# Patient Record
Sex: Male | Born: 2000 | Race: White | Hispanic: No | Marital: Single | Smoking: Never smoker
Health system: Southern US, Community
[De-identification: ages and names within clinical notes are randomized; demographics above are authoritative.]

## PROBLEM LIST (undated history)

## (undated) DIAGNOSIS — F909 Attention-deficit hyperactivity disorder, unspecified type: Secondary | ICD-10-CM

---

## 2021-03-06 ENCOUNTER — Emergency Department
Admission: EM | Admit: 2021-03-06 | Discharge: 2021-03-06 | Disposition: A | Discharge status: Home / Self Care | Payer: Managed Care, Other (non HMO) | Attending: Emergency Medicine | Admitting: Emergency Medicine

## 2021-03-06 ENCOUNTER — Other Ambulatory Visit: Payer: Self-pay

## 2021-03-06 DIAGNOSIS — R509 Fever, unspecified: Secondary | ICD-10-CM | POA: Diagnosis present

## 2021-03-06 DIAGNOSIS — B349 Viral infection, unspecified: Secondary | ICD-10-CM | POA: Diagnosis not present

## 2021-03-06 DIAGNOSIS — R000 Tachycardia, unspecified: Secondary | ICD-10-CM | POA: Insufficient documentation

## 2021-03-06 DIAGNOSIS — Z20822 Contact with and (suspected) exposure to covid-19: Secondary | ICD-10-CM | POA: Insufficient documentation

## 2021-03-06 LAB — RESP PANEL BY RT-PCR (FLU A&B, COVID) ARPGX2
Influenza A by PCR: NEGATIVE
Influenza B by PCR: NEGATIVE
SARS Coronavirus 2 by RT PCR: NEGATIVE

## 2021-03-06 NOTE — ED Triage Notes (Signed)
Pt arrives via pov from home. Pt reports body aches 2 days ago and fever starting yesterday. NAD noted at this time. Pt denies any N/V/D.

## 2021-03-06 NOTE — ED Provider Notes (Signed)
[facility]  ____________________________________________   Event Date/Time   First MD Initiated Contact with Patient 03/06/21 1214     (approximate)  I have reviewed the triage vital signs and the nursing notes.   HISTORY  Chief Complaint Fever and Generalized Body Aches    HPI Noah Luna is a 20 y.o. male with no significant past medical history who presents with fever and body aches.  Symptoms started yesterday.  Has had a mild headache without associated nausea vomiting visual change numbness or weakness.  No neck pain.  Has also had diffuse myalgias and back pain that is bilateral.  No cough congestion shortness of breath or chest pain.  Has had decreased appetite but is still tolerating p.o.  No sick contacts.         History reviewed. No pertinent past medical history.  There are no problems to display for this patient.   History reviewed. No pertinent surgical history.  Prior to Admission medications   Not on File    Allergies Patient has no allergy information on record.  History reviewed. No pertinent family history.  Social History Social History   Tobacco Use   Smoking status: Never   Smokeless tobacco: Never  Substance Use Topics   Alcohol use: Never   Drug use: Never    Review of Systems   Review of Systems  Constitutional:  Positive for activity change, chills and fever.  Eyes:  Negative for visual disturbance.  Respiratory:  Negative for cough and shortness of breath.   Cardiovascular:  Negative for chest pain.  Gastrointestinal:  Negative for abdominal pain and vomiting.  Genitourinary:  Negative for dysuria.  Musculoskeletal:  Positive for back pain and myalgias.  Neurological:  Positive for headaches. Negative for weakness and numbness.  All other systems reviewed and are negative.  Physical Exam Updated Vital Signs BP 119/71   Pulse (!) 104   Temp 98.8 F (37.1 C) (Oral)   Resp 16   Ht 5\' 10"  (1.778  m)   Wt 63.5 kg   SpO2 100%   BMI 20.09 kg/m   Physical Exam Vitals and nursing note reviewed.  Constitutional:      General: He is not in acute distress.    Appearance: Normal appearance.  HENT:     Head: Normocephalic and atraumatic.     Nose: Nose normal.  Eyes:     General: No scleral icterus.    Conjunctiva/sclera: Conjunctivae normal.  Cardiovascular:     Rate and Rhythm: Regular rhythm. Tachycardia present.  Pulmonary:     Effort: Pulmonary effort is normal. No respiratory distress.     Breath sounds: Normal breath sounds. No wheezing.  Abdominal:     General: Abdomen is flat. There is no distension.     Palpations: Abdomen is soft.     Tenderness: There is no abdominal tenderness. There is no guarding.  Musculoskeletal:        General: No deformity or signs of injury.     Cervical back: Normal range of motion and neck supple.  Skin:    Coloration: Skin is not jaundiced or pale.  Neurological:     General: No focal deficit present.     Mental Status: He is alert and oriented to person, place, and time. Mental status is at baseline.  Psychiatric:        Mood and Affect: Mood normal.        Behavior: Behavior normal.     LABS (  all labs ordered are listed, but only abnormal results are displayed)  Labs Reviewed  RESP PANEL BY RT-PCR (FLU A&B, COVID) [id number]   ____________________________________________  EKG  N/a ____________________________________________  RADIOLOGY Almeta Monas, personally viewed and evaluated these images (plain radiographs) as part of my medical decision making, as well as reviewing the written report by the radiologist.  ED MD interpretation:  n/a    ____________________________________________   PROCEDURES  Procedure(s) performed (including Critical Care):  Procedures   ____________________________________________   INITIAL IMPRESSION / ASSESSMENT AND PLAN / ED COURSE     Patient is a 20 year old male  presenting with fever myalgias and headache.  He is mildly tachycardic but afebrile here.  He appears very well on exam.  Lungs are clear abdomen is soft he appears well-hydrated.  He has myalgias and back pain but no midline thoracic or lumbar tenderness.  He has no urinary symptoms.  Also with headache but no meningismus.  Suspect viral syndrome.  COVID and flu testing are negative here.  We discussed return precautions and supportive care.      ____________________________________________   FINAL CLINICAL IMPRESSION(S) / ED DIAGNOSES  Final diagnoses:  Viral syndrome     ED Discharge Orders     None        Note:  This document was prepared using Dragon voice recognition software and may include unintentional dictation errors.    Rada Hay, MD 03/06/21 [phone]

## 2021-03-06 NOTE — ED Notes (Signed)
Pt to ED for HA, fever, body aches since 2 days ago. Pt took tylenol today, states this morning fever was 102.8. In NAD, obtaining vitals.

## 2021-03-06 NOTE — Discharge Instructions (Signed)
You likely have a viral illness.  I have sent a test for COVID and influenza.  You can log onto MyChart to see this results.

## 2021-03-07 ENCOUNTER — Emergency Department: Payer: Managed Care, Other (non HMO)

## 2021-03-07 ENCOUNTER — Other Ambulatory Visit: Payer: Self-pay

## 2021-03-07 ENCOUNTER — Emergency Department
Admission: EM | Admit: 2021-03-07 | Discharge: 2021-03-07 | Disposition: A | Discharge status: Home / Self Care | Payer: Managed Care, Other (non HMO) | Attending: Emergency Medicine | Admitting: Emergency Medicine

## 2021-03-07 DIAGNOSIS — B349 Viral infection, unspecified: Secondary | ICD-10-CM | POA: Diagnosis not present

## 2021-03-07 DIAGNOSIS — R509 Fever, unspecified: Secondary | ICD-10-CM | POA: Diagnosis present

## 2021-03-07 NOTE — ED Triage Notes (Signed)
Pt comes with c/o persistant fever. Pt states he was seen here yesterday and evaluated. Pt states he came back today because he can't control his fever and has some ringing in his head.

## 2021-03-07 NOTE — ED Provider Notes (Signed)
[facility] Emergency Department Provider Note   ____________________________________________   Event Date/Time   First MD Initiated Contact with Patient 03/07/21 1008     (approximate)  I have reviewed the triage vital signs and the nursing notes.   HISTORY  Chief Complaint Fever    HPI Delmus Warwick is a 20 y.o. male with no significant past medical history presents to the ED complaining of fever.  Patient reports that he has had about 3 days of persistent fevers as high as 102.8.  This is been associated with body aches, generalized malaise, and cough.  Patient is not aware of any sick contacts, denies any chest pain, shortness of breath, nausea, vomiting, abdominal pain, or dysuria.  He was seen in the ED yesterday for the symptoms, had testing that was negative for COVID-19 and influenza.  He states he has been taking ibuprofen and Tylenol for fever since then, but fever seems to keep coming back.  He is concerned that he could have pneumonia as he has dealt with this in the past.        History reviewed. No pertinent past medical history.  There are no problems to display for this patient.   History reviewed. No pertinent surgical history.  Prior to Admission medications   Not on File    Allergies Patient has no allergy information on record.  No family history on file.  Social History Social History   Tobacco Use   Smoking status: Never   Smokeless tobacco: Never  Substance Use Topics   Alcohol use: Never   Drug use: Never    Review of Systems  Constitutional: Positive for fever/chills Eyes: No visual changes. ENT: No sore throat. Cardiovascular: Denies chest pain. Respiratory: Denies shortness of breath.  Positive for cough. Gastrointestinal: No abdominal pain.  No nausea, no vomiting.  No diarrhea.  No constipation. Genitourinary: Negative for dysuria. Musculoskeletal: Negative for back pain.  Positive for myalgias.    Skin: Negative for rash. Neurological: Negative for headaches, focal weakness or numbness.  ____________________________________________   PHYSICAL EXAM:  VITAL SIGNS: ED Triage Vitals  Enc Vitals Group     BP 03/07/21 0947 104/71     Pulse Rate 03/07/21 0947 94     Resp 03/07/21 0947 16     Temp 03/07/21 0947 98.1 F (36.7 C)     Temp Source 03/07/21 0947 Oral     SpO2 03/07/21 0947 97 %     Weight --      Height --      Head Circumference --      Peak Flow --      Pain Score 03/07/21 0938 7     Pain Loc --      Pain Edu? --      Excl. in [place]? --     Constitutional: Alert and oriented. Eyes: Conjunctivae are normal. Head: Atraumatic. Nose: No congestion/rhinnorhea. Mouth/Throat: Mucous membranes are moist. Neck: Normal ROM Cardiovascular: Normal rate, regular rhythm. Grossly normal heart sounds.  2+ radial pulses bilaterally. Respiratory: Normal respiratory effort.  No retractions. Lungs CTAB. Gastrointestinal: Soft and nontender. No distention. Genitourinary: deferred Musculoskeletal: No lower extremity tenderness nor edema. Neurologic:  Normal speech and language. No gross focal neurologic deficits are appreciated. Skin:  Skin is warm, dry and intact. No rash noted. Psychiatric: Mood and affect are normal. Speech and behavior are normal.  ____________________________________________   LABS (all labs ordered are listed, but only abnormal results are displayed)  Labs  Reviewed - No data to display   PROCEDURES  Procedure(s) performed (including Critical Care):  Procedures   ____________________________________________   INITIAL IMPRESSION / ASSESSMENT AND PLAN / ED COURSE      20 year old male with no significant past medical history presents to the ED with ongoing fevers associated with body aches, cough, and malaise.  Patient is well-appearing and afebrile here in the ED, states that he took ibuprofen around 8:00 this morning.  Symptoms appear  consistent with a viral syndrome, viral testing was negative yesterday for COVID-19 or influenza.  Chest x-ray from performed today and reviewed by me with no infiltrate, edema, or effusion.  Patient has benign abdominal exam with no urinary symptoms, no symptoms to suggest meningitis.  He is appropriate for outpatient management, was counseled to continue Tylenol and ibuprofen and drink plenty of fluids.  He was counseled to return to the ED for new worsening symptoms, patient agrees with plan.      ____________________________________________   FINAL CLINICAL IMPRESSION(S) / ED DIAGNOSES  Final diagnoses:  Fever, unspecified fever cause  Viral syndrome     ED Discharge Orders     None        Note:  This document was prepared using Dragon voice recognition software and may include unintentional dictation errors.    Chesley Noon, MD 03/07/21 1144

## 2021-03-07 NOTE — ED Notes (Addendum)
Pt to ED c/o continued fever since 2 days ago. Took advil, T currently wnl. Pt states he feels clammy and hot and that this morning he had "ringing" in his head. Pt states he has also been having muscle aches after walking short distances.   Pt states had PNA 10/22 and this feels similar.

## 2021-08-18 ENCOUNTER — Ambulatory Visit
Admission: EM | Admit: 2021-08-18 | Discharge: 2021-08-18 | Disposition: A | Discharge status: Home / Self Care | Payer: Managed Care, Other (non HMO) | Attending: Emergency Medicine | Admitting: Emergency Medicine

## 2021-08-18 ENCOUNTER — Other Ambulatory Visit: Payer: Self-pay

## 2021-08-18 ENCOUNTER — Encounter: Payer: Self-pay | Admitting: Emergency Medicine

## 2021-08-18 DIAGNOSIS — J029 Acute pharyngitis, unspecified: Secondary | ICD-10-CM

## 2021-08-18 DIAGNOSIS — J0100 Acute maxillary sinusitis, unspecified: Secondary | ICD-10-CM | POA: Diagnosis not present

## 2021-08-18 HISTORY — DX: Attention-deficit hyperactivity disorder, unspecified type: F90.9

## 2021-08-18 LAB — POCT RAPID STREP A (OFFICE): Rapid Strep A Screen: NEGATIVE

## 2021-08-18 MED ORDER — AMOXICILLIN 875 MG PO TABS: 875.0000 mg | ORAL_TABLET | Freq: Two times a day (BID) | ORAL | 0 refills | Status: AC

## 2021-08-18 NOTE — ED Provider Notes (Signed)
Noah Luna    CSN: [id number] Arrival date & time: 08/18/21  1217      History   Chief Complaint Chief Complaint  Patient presents with   Sore Throat    HPI Noah Luna is a 21 y.o. male. Patient presents with 9 day history of sore throat.  He also reports postnasal drip and sinus congestion.  He had chills and headache earlier in the week but these have improved.  He denies fever, rash, shortness of breath, vomiting, diarrhea, or other symptoms. Treatment at home with Sudafed, Dayquil, Nyquil, Advil.  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  The history is provided by the patient.   Past Medical History:  Diagnosis Date   ADHD     There are no problems to display for this patient.   History reviewed. No pertinent surgical history.     Home Medications    Prior to Admission medications   Medication Sig Start Date End Date Taking? Authorizing Provider  amoxicillin (AMOXIL) 875 MG tablet Take 1 tablet (875 mg total) by mouth 2 (two) times daily for 10 days. 08/18/21 08/28/21 Yes Sharion Balloon, NP  atomoxetine (STRATTERA) 18 MG capsule Take 18 mg by mouth every morning. 08/06/21  Yes [provider]    Family History History reviewed. No pertinent family history.  Social History Social History   Tobacco Use   Smoking status: Never   Smokeless tobacco: Never  Vaping Use   Vaping Use: Never used  Substance Use Topics   Alcohol use: Never   Drug use: Never     Allergies   Patient has no known allergies.   Review of Systems Review of Systems  Constitutional:  Positive for chills. Negative for fever.  HENT:  Positive for congestion, postnasal drip and sore throat. Negative for ear pain.   Respiratory:  Negative for cough and shortness of breath.   Cardiovascular:  Negative for palpitations.  Gastrointestinal:  Negative for diarrhea and vomiting.  Skin:  Negative for color change and rash.   Neurological:  Positive for headaches.  All other systems reviewed and are negative.   Physical Exam Triage Vital Signs ED Triage Vitals  Enc Vitals Group     BP      Pulse      Resp      Temp      Temp src      SpO2      Weight      Height      Head Circumference      Peak Flow      Pain Score      Pain Loc      Pain Edu?      Excl. in [place]?    No data found.  Updated Vital Signs BP 101/86 (BP Location: Left Arm)   Pulse 92   Temp 98 F (36.7 C) (Oral)   Resp 12   SpO2 100%   Visual Acuity Right Eye Distance:   Left Eye Distance:   Bilateral Distance:    Right Eye Near:   Left Eye Near:    Bilateral Near:     Physical Exam Vitals and nursing note reviewed.  Constitutional:      General: He is not in acute distress.    Appearance: Normal appearance. He is well-developed. He is not ill-appearing.  HENT:     Right Ear: Tympanic membrane normal.  Left Ear: Tympanic membrane normal.     Nose: Nose normal.     Mouth/Throat:     Mouth: Mucous membranes are moist.     Pharynx: Posterior oropharyngeal erythema present.  Cardiovascular:     Rate and Rhythm: Normal rate and regular rhythm.     Heart sounds: Normal heart sounds.  Pulmonary:     Effort: Pulmonary effort is normal. No respiratory distress.     Breath sounds: Normal breath sounds.  Musculoskeletal:     Cervical back: Neck supple.  Skin:    General: Skin is warm and dry.  Neurological:     Mental Status: He is alert.  Psychiatric:        Mood and Affect: Mood normal.        Behavior: Behavior normal.     UC Treatments / Results  Labs (all labs ordered are listed, but only abnormal results are displayed) Labs Reviewed  POCT RAPID STREP A (OFFICE)    EKG   Radiology No results found.  Procedures Procedures (including critical care time)  Medications Ordered in UC Medications - No data to display  Initial Impression / Assessment and Plan / UC Course  I have reviewed the  triage vital signs and the nursing notes.  Pertinent labs & imaging results that were available during my care of the patient were reviewed by me and considered in my medical decision making (see chart for details).   Acute sinusitis, sore throat.  Rapid strep negative.  Patient has been symptomatic for 9 days and is not improving despite multiple OTC medications.  Treating with amoxicillin.  Discussed symptomatic care.  Instructed him to follow-up with his PCP if his symptoms are not improving.  He agrees to plan of care.    Final Clinical Impressions(s) / UC Diagnoses   Final diagnoses:  Acute non-recurrent maxillary sinusitis  Sore throat     Discharge Instructions      Take the amoxicillin as directed.  Follow up with your primary care provider if your symptoms are not improving.        ED Prescriptions     Medication Sig Dispense Auth. Provider   amoxicillin (AMOXIL) 875 MG tablet Take 1 tablet (875 mg total) by mouth 2 (two) times daily for 10 days. 20 tablet Sharion Balloon, NP      PDMP not reviewed this encounter.   Sharion Balloon, NP 08/18/21 [phone]

## 2021-08-18 NOTE — ED Triage Notes (Signed)
Patient c/o Sore throat x 9 days.   Patient denies fever when checking temperature at home.   Patient endorses painful swallowing with worsening symptoms in morning and night.   Patient endorses headache and chills upon onset of symptoms.   Patient has taken Dayqui, Sudafed, and Advil with no relief of symptoms.

## 2021-08-18 NOTE — Discharge Instructions (Addendum)
Take the amoxicillin as directed.  Follow up with your primary care provider if your symptoms are not improving.   ° ° °

## 2022-11-29 IMAGING — CR DG CHEST 2V
1 series · 2 of 2 positions shown · non-contrast
Comparison: None.

CLINICAL DATA: Fever and cough.

EXAM:
CHEST - 2 VIEW

[Series 1: dg chest 2 view · 0.14mm/px · 2 of 2 slices shown]
[im 1/2]
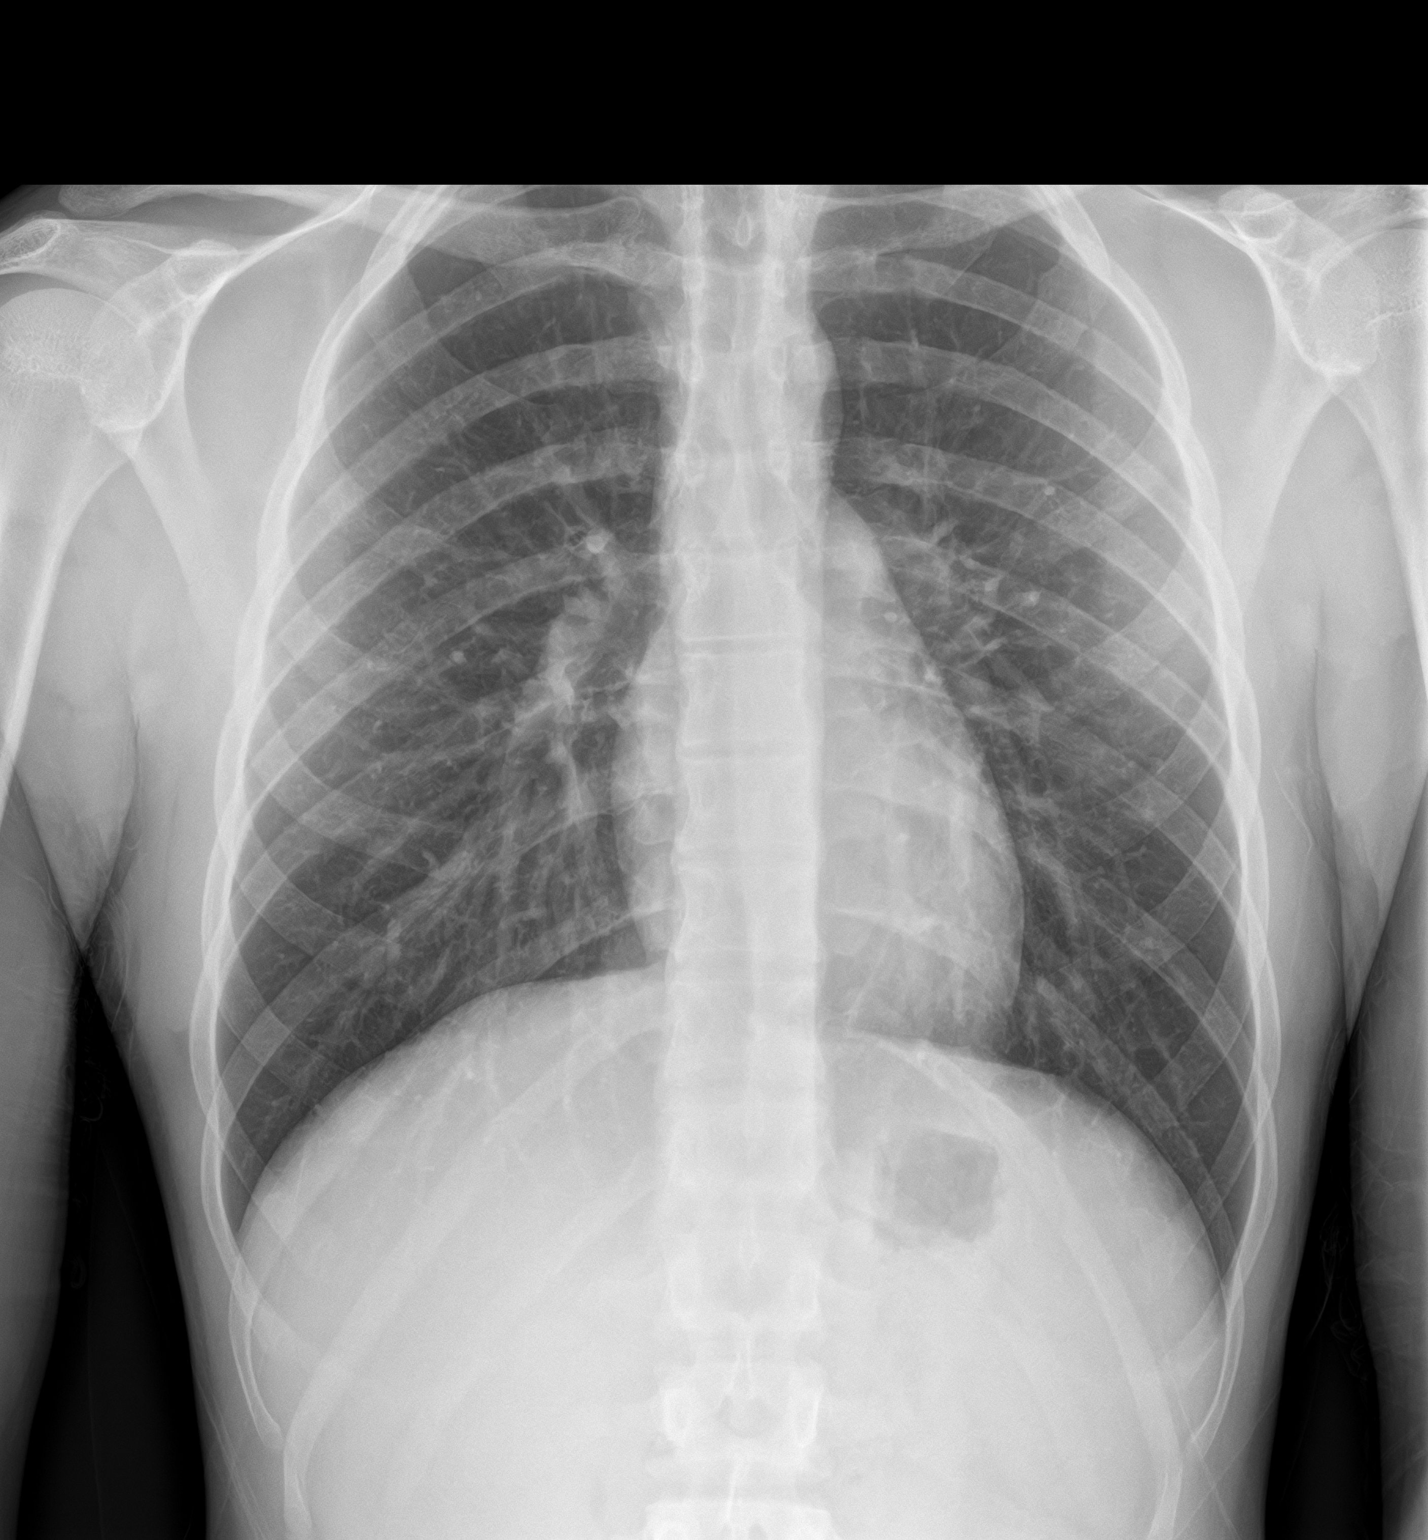
[im 2/2]
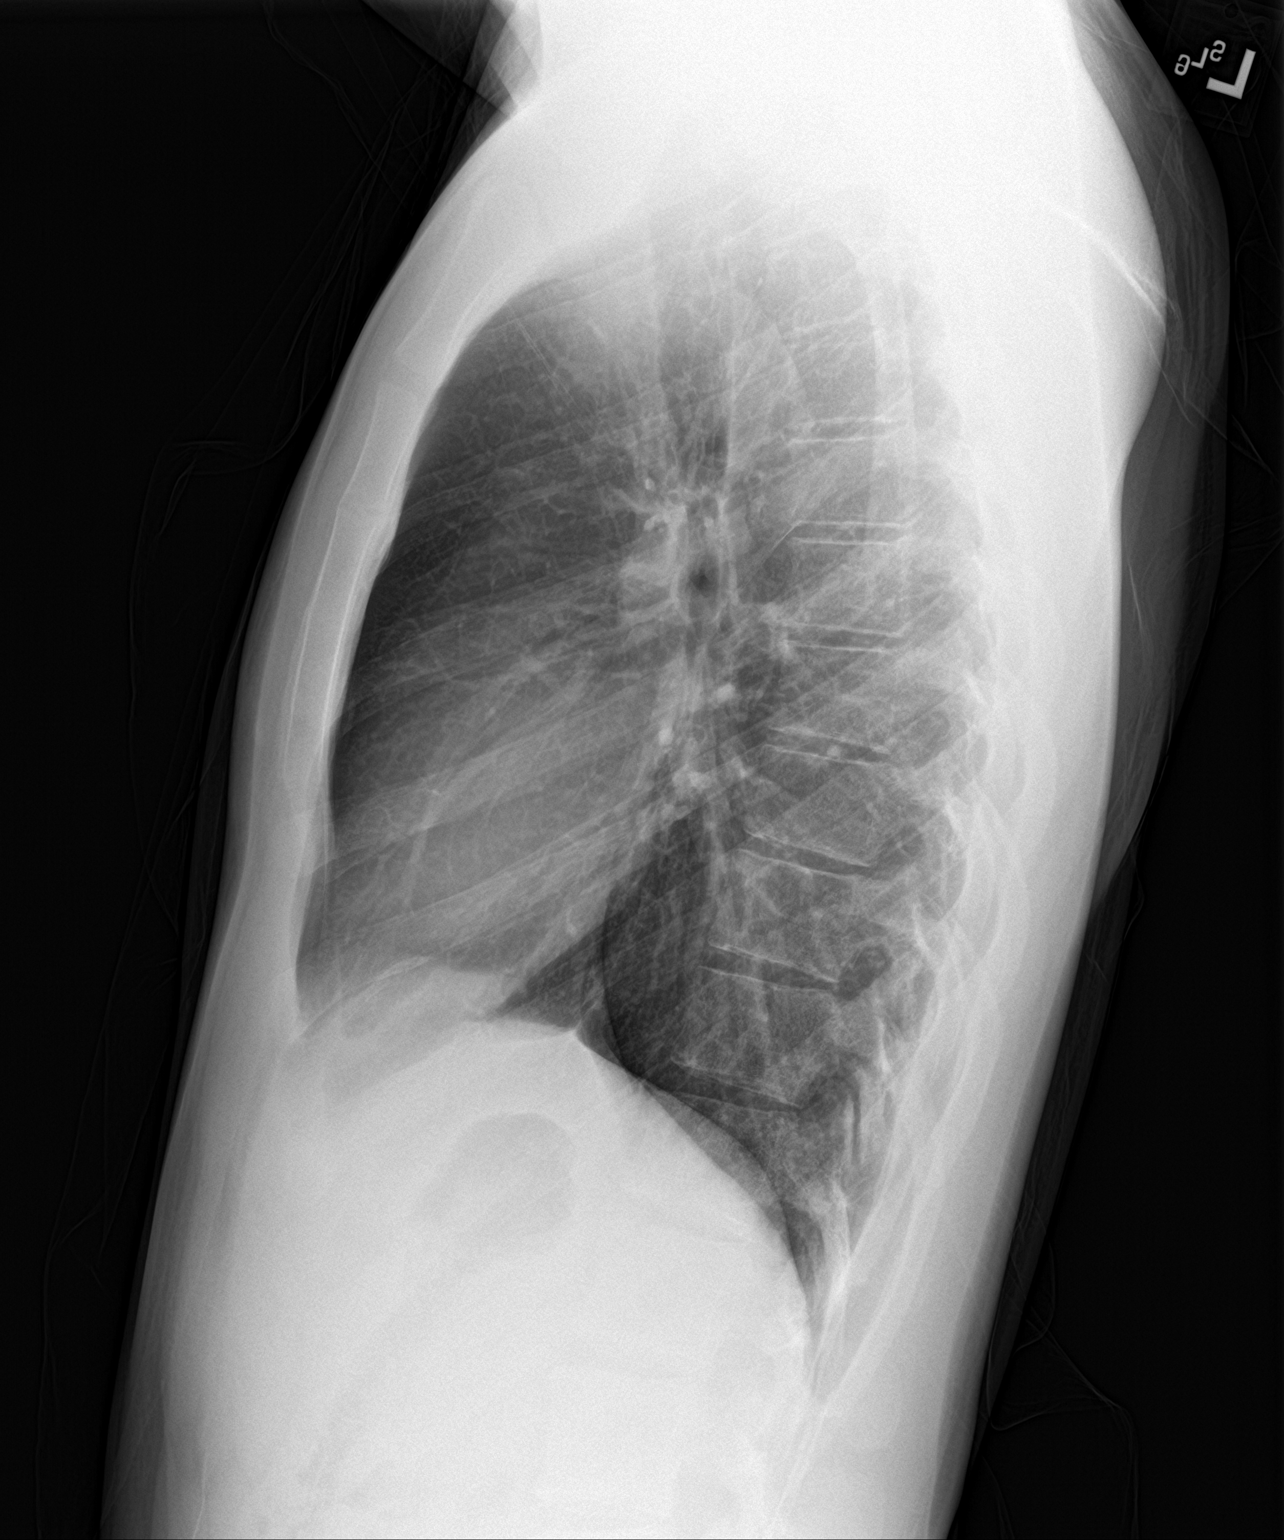

[2 of 2 positions shown; findings below may reference images not displayed]

FINDINGS: The heart size and mediastinal contours are within normal limits.
Both lungs are clear. The visualized skeletal structures are
unremarkable.
IMPRESSION: No active cardiopulmonary disease.
